# Patient Record
Sex: Male | Born: 1955 | Hispanic: No | Marital: Married | Smoking: Never smoker
Health system: Southern US, Community
[De-identification: ages and names within clinical notes are randomized; demographics above are authoritative.]

---

## 2019-11-03 ENCOUNTER — Emergency Department (HOSPITAL_COMMUNITY)
Admission: EM | Admit: 2019-11-03 | Discharge: 2019-11-04 | Disposition: A | Discharge status: Home / Self Care | Payer: Self-pay | Attending: Emergency Medicine | Admitting: Emergency Medicine

## 2019-11-03 ENCOUNTER — Other Ambulatory Visit: Payer: Self-pay

## 2019-11-03 ENCOUNTER — Encounter (HOSPITAL_COMMUNITY): Payer: Self-pay | Admitting: Emergency Medicine

## 2019-11-03 DIAGNOSIS — K5900 Constipation, unspecified: Secondary | ICD-10-CM | POA: Insufficient documentation

## 2019-11-03 DIAGNOSIS — S0990XA Unspecified injury of head, initial encounter: Secondary | ICD-10-CM | POA: Insufficient documentation

## 2019-11-03 DIAGNOSIS — R509 Fever, unspecified: Secondary | ICD-10-CM | POA: Insufficient documentation

## 2019-11-03 DIAGNOSIS — W010XXA Fall on same level from slipping, tripping and stumbling without subsequent striking against object, initial encounter: Secondary | ICD-10-CM | POA: Insufficient documentation

## 2019-11-03 DIAGNOSIS — R079 Chest pain, unspecified: Secondary | ICD-10-CM | POA: Insufficient documentation

## 2019-11-03 MED ORDER — ACETAMINOPHEN 500 MG PO TABS
1000.0000 mg | ORAL_TABLET | Freq: Once | ORAL | Status: AC
  Administered 2019-11-03: 1000 mg via ORAL
  Filled 2019-11-03: qty 2
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
## 2019-11-04 ENCOUNTER — Emergency Department (HOSPITAL_COMMUNITY): Payer: Self-pay

## 2019-11-04 MED ORDER — LIDOCAINE-EPINEPHRINE (PF) 2 %-1:200000 IJ SOLN: 20.0000 mL | Freq: Once | INTRAMUSCULAR | Status: DC

## 2019-11-06 ENCOUNTER — Emergency Department (HOSPITAL_COMMUNITY)
Admission: EM | Admit: 2019-11-06 | Discharge: 2019-11-06 | Disposition: A | Discharge status: Home / Self Care | Payer: Worker's Compensation | Attending: Emergency Medicine | Admitting: Emergency Medicine

## 2019-11-06 ENCOUNTER — Emergency Department (HOSPITAL_COMMUNITY): Payer: Worker's Compensation

## 2019-11-06 ENCOUNTER — Encounter (HOSPITAL_COMMUNITY): Payer: Self-pay | Admitting: Emergency Medicine

## 2019-11-06 DIAGNOSIS — R1110 Vomiting, unspecified: Secondary | ICD-10-CM | POA: Diagnosis not present

## 2019-11-06 DIAGNOSIS — Z20822 Contact with and (suspected) exposure to covid-19: Secondary | ICD-10-CM | POA: Diagnosis not present

## 2019-11-06 DIAGNOSIS — F0781 Postconcussional syndrome: Secondary | ICD-10-CM | POA: Diagnosis not present

## 2019-11-06 DIAGNOSIS — R42 Dizziness and giddiness: Secondary | ICD-10-CM | POA: Insufficient documentation

## 2019-11-06 DIAGNOSIS — S0990XA Unspecified injury of head, initial encounter: Secondary | ICD-10-CM | POA: Diagnosis present

## 2019-11-06 LAB — RESPIRATORY PANEL BY RT PCR (FLU A&B, COVID)
Influenza A by PCR: NEGATIVE
Influenza B by PCR: NEGATIVE
SARS Coronavirus 2 by RT PCR: NEGATIVE

## 2019-11-06 LAB — CBC
HCT: 42 % (ref 39.0–52.0)
Hemoglobin: 14.3 g/dL (ref 13.0–17.0)
MCH: 29.6 pg (ref 26.0–34.0)
MCHC: 34 g/dL (ref 30.0–36.0)
MCV: 87 fL (ref 80.0–100.0)
Platelets: 201 10*3/uL (ref 150–400)
RBC: 4.83 MIL/uL (ref 4.22–5.81)
RDW: 12.7 % (ref 11.5–15.5)
WBC: 8.3 10*3/uL (ref 4.0–10.5)
nRBC: 0 % (ref 0.0–0.2)

## 2019-11-06 LAB — COMPREHENSIVE METABOLIC PANEL
ALT: 20 U/L (ref 0–44)
AST: 22 U/L (ref 15–41)
Albumin: 4 g/dL (ref 3.5–5.0)
Alkaline Phosphatase: 66 U/L (ref 38–126)
Anion gap: 8 (ref 5–15)
BUN: 16 mg/dL (ref 8–23)
CO2: 22 mmol/L (ref 22–32)
Calcium: 8.7 mg/dL — ABNORMAL LOW (ref 8.9–10.3)
Chloride: 107 mmol/L (ref 98–111)
Creatinine, Ser: 0.97 mg/dL (ref 0.61–1.24)
GFR calc Af Amer: >60 mL/min (ref >60)
GFR calc non Af Amer: >60 mL/min (ref >60)
Glucose, Bld: 181 mg/dL — ABNORMAL HIGH (ref 70–99)
Potassium: 4.2 mmol/L (ref 3.5–5.1)
Sodium: 137 mmol/L (ref 135–145)
Total Bilirubin: 0.7 mg/dL (ref 0.3–1.2)
Total Protein: 7.1 g/dL (ref 6.5–8.1)

## 2019-11-06 LAB — URINALYSIS, ROUTINE W REFLEX MICROSCOPIC
Bacteria, UA: NONE SEEN
Bilirubin Urine: NEGATIVE
Glucose, UA: NEGATIVE mg/dL
Ketones, ur: 5 mg/dL — AB
Leukocytes,Ua: NEGATIVE
Nitrite: NEGATIVE
Protein, ur: NEGATIVE mg/dL
Specific Gravity, Urine: 1.021 (ref 1.005–1.030)
pH: 5 (ref 5.0–8.0)

## 2019-11-06 LAB — LIPASE, BLOOD: Lipase: 38 U/L (ref 11–51)

## 2019-11-06 MED ORDER — MECLIZINE HCL 25 MG PO TABS
25.0000 mg | ORAL_TABLET | Freq: Once | ORAL | Status: AC
  Administered 2019-11-06: 25 mg via ORAL
  Filled 2019-11-06: qty 1

## 2019-11-06 MED ORDER — IOHEXOL 350 MG/ML SOLN
80.0000 mL | Freq: Once | INTRAVENOUS | Status: AC | PRN
  Administered 2019-11-06: 80 mL via INTRAVENOUS

## 2019-11-06 MED ORDER — MECLIZINE HCL 25 MG PO TABS: 25.0000 mg | ORAL_TABLET | Freq: Three times a day (TID) | ORAL | 0 refills | Status: AC | PRN

## 2019-11-06 NOTE — ED Notes (Signed)
CT made aware IV placed
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
## 2019-11-06 NOTE — ED Notes (Signed)
Pt reports dizziness requesting oral meclizine prior to discharge.
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
